# Patient Record
Sex: Male | Born: 2000 | Hispanic: Yes | Marital: Single | Smoking: Never smoker
Health system: Southern US, Community
[De-identification: ages and names within clinical notes are randomized; demographics above are authoritative.]

---
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
## 2019-02-06 ENCOUNTER — Encounter: Payer: Self-pay | Admitting: Nurse Practitioner

## 2019-02-06 ENCOUNTER — Other Ambulatory Visit: Payer: Self-pay

## 2019-02-06 ENCOUNTER — Ambulatory Visit (INDEPENDENT_AMBULATORY_CARE_PROVIDER_SITE_OTHER): Payer: Medicaid Other | Admitting: Nurse Practitioner

## 2019-02-06 VITALS — BP 122/67 | HR 90 | Temp 99.1°F | Resp 20 | Ht 74.0 in | Wt 197.0 lb

## 2019-02-06 DIAGNOSIS — N6310 Unspecified lump in the right breast, unspecified quadrant: Secondary | ICD-10-CM | POA: Diagnosis not present

## 2019-02-06 DIAGNOSIS — N62 Hypertrophy of breast: Secondary | ICD-10-CM | POA: Diagnosis not present

## 2019-02-06 NOTE — Patient Instructions (Signed)
Gynecomastia, Adult Gynecomastia is an overgrowth of gland tissue in a man's breasts. This may cause one or both breasts to become enlarged. This often develops in men who have an imbalance of the male sex hormone (testosterone) and the male sex hormone (estrogen). This means that a man may have too much estrogen, too little testosterone, or both. Gynecomastia may be a normal part of aging for some men. It can also happen to adolescent boys during puberty. What are the causes? Gynecomastia may be caused by:  Certain medicines, such as: ? Estrogen supplements and medicines that act like estrogen in the body. ? Medicines that keep testosterone from functioning normally in the body (testosterone-inhibiting drugs). ? Anabolic steroids. ? Medicines to treat heartburn, cancer, heart disease, mental health problems, HIV (human immunodeficiency virus) or AIDS (acquired immunodeficiency syndrome). ? Antibiotic medicine. ? Chemotherapy medicine.  Recreational drugs, including alcohol, marijuana, and opioids.  Herbal products, including lavender and tea tree oil.  A gene that is passed along from parent to child (inherited).  Tumors in the pituitary or adrenal gland.  An overactive thyroid gland.  Certain inherited disorders, including a genetic disease that causes low testosterone in males (Klinefelter syndrome).  Cancer of the lung, kidney, liver, testicle, or gastrointestinal tract.  Conditions that cause liver or kidney failure.  Poor nutrition and starvation.  Testicle shrinking or failure (testicularatrophy). In some cases, the cause may not be known. What increases the risk? You may have a higher risk for gynecomastia if you:  Are 50 years old or older.  Are overweight.  Abuse alcohol or other drugs.  Have a family history of gynecomastia. What are the signs or symptoms?  Most of the time, breast enlargement is the only symptom. The enlargement may start near the nipple,  and the breast tissue may feel firm and rubbery. The breast may feel itchy, painful or tender. How is this diagnosed? This condition may be diagnosed based on:  Your symptoms.  Your medical history.  A physical exam.  Imaging tests, such as: ? An ultrasound. ? A mammogram. ? An MRI.  Blood tests.  Removal of a sample of breast tissue to be tested in a lab (biopsy). How is this treated? Gynecomastia may go away on its own, without treatment. If gynecomastia is caused by a medical problem or drug abuse, treatment may include:  Getting treatment for the underlying medical problem or for drug abuse.  Changing or stopping medicines.  Medicines to block the effects of estrogen.  Taking a testosterone replacement.  Surgery to remove breast tissue or any lumps in your breasts.  Breast reduction surgery. This may be a possibility if you have severe or painful gynecomastia. Follow these instructions at home:  Take over-the-counter and prescription medicines only as told by your health care provider.  Talk to your health care provider before taking any herbal medicines or diet supplements.  Do not abuse drugs or alcohol.  Keep all follow-up visits as told by your health care provider. This is important. Contact a health care provider if:  Your breast tissue grows larger or gets more swollen or painful.  You have a lump in your testicle.  You have blood or discharge coming from your nipples.  Your nipple changes shape.  You develop a hard or painful lump in your breast. This information is not intended to replace advice given to you by your health care provider. Make sure you discuss any questions you have with your health care provider. Document   Released: 04/16/2015 Document Revised: 10/14/2015 Document Reviewed: 04/16/2015 Elsevier Patient Education  2020 Elsevier Inc.  

## 2019-02-06 NOTE — Progress Notes (Signed)
   Subjective:    Patient ID: Jordan Alvarado, male    DOB: 2001/02/10, 18 y.o.   MRN: [id number]  Chief Complaint: Knot under right nipple   HPI patine tcomes in c/o of a knot under right nipple. Has been there for several months. He had it in high school and had U/S and was told it was gynocomastia. It seemed to go away or get smaller for awhile and he did not notice it but now it is back.. no nipple drainage. No tenderness.   Review of Systems  Constitutional: Negative.   Respiratory: Negative.   Cardiovascular: Negative.   Neurological: Negative.   Psychiatric/Behavioral: Negative.   All other systems reviewed and are negative.      Objective:   Physical Exam Vitals signs and nursing note reviewed.  Constitutional:      Appearance: Normal appearance.  Cardiovascular:     Rate and Rhythm: Normal rate and regular rhythm.     Heart sounds: Normal heart sounds.  Chest:     Breasts:        Right: Mass present. No swelling, nipple discharge, skin change or tenderness.        Left: Normal.    Neurological:     Mental Status: He is alert.    BP 122/67   Pulse 90   Temp 99.1 F (37.3 C) (Temporal)   Resp 20   Ht 6\' 2"  (1.88 m)   Wt 197 lb (89.4 kg)   SpO2 96%   BMI 25.29 kg/m         Assessment & Plan:  [name] in today with chief complaint of Knot under right nipple   1. Breast mass, right Will talk once results are back - US BREAST COMPLETE UNI RIGHT INC AXILLA; Future  Mary-Margaret Hassell Done, FNP

## 2019-02-25 ENCOUNTER — Ambulatory Visit (HOSPITAL_COMMUNITY): Admission: RE | Admit: 2019-02-25 | Payer: Medicaid Other | Source: Ambulatory Visit

## 2019-03-11 ENCOUNTER — Other Ambulatory Visit: Payer: Self-pay

## 2019-03-11 ENCOUNTER — Ambulatory Visit (HOSPITAL_COMMUNITY)
Admission: RE | Admit: 2019-03-11 | Discharge: 2019-03-11 | Disposition: A | Discharge status: Home / Self Care | Payer: Medicaid Other | Source: Ambulatory Visit | Attending: Nurse Practitioner | Admitting: Nurse Practitioner

## 2019-03-11 DIAGNOSIS — N62 Hypertrophy of breast: Secondary | ICD-10-CM | POA: Insufficient documentation

## 2019-03-11 DIAGNOSIS — N6341 Unspecified lump in right breast, subareolar: Secondary | ICD-10-CM | POA: Diagnosis not present

## 2019-03-11 DIAGNOSIS — N6310 Unspecified lump in the right breast, unspecified quadrant: Secondary | ICD-10-CM

## 2019-05-05 ENCOUNTER — Ambulatory Visit (INDEPENDENT_AMBULATORY_CARE_PROVIDER_SITE_OTHER): Payer: Medicaid Other | Admitting: Family Medicine

## 2019-05-05 ENCOUNTER — Encounter: Payer: Self-pay | Admitting: Family Medicine

## 2019-05-05 DIAGNOSIS — J329 Chronic sinusitis, unspecified: Secondary | ICD-10-CM

## 2019-05-05 DIAGNOSIS — J40 Bronchitis, not specified as acute or chronic: Secondary | ICD-10-CM | POA: Diagnosis not present

## 2019-05-05 MED ORDER — AZITHROMYCIN 250 MG PO TABS: ORAL_TABLET | ORAL | 0 refills | Status: DC

## 2019-05-05 MED ORDER — BENZONATATE 200 MG PO CAPS: 200.0000 mg | ORAL_CAPSULE | Freq: Three times a day (TID) | ORAL | 0 refills | Status: DC | PRN

## 2019-05-05 NOTE — Progress Notes (Signed)
Subjective:    Patient ID: Jordan Alvarado, male    DOB: 2000/03/15, 19 y.o.   MRN: [id number]   HPI: Jordan Alvarado is a 19 y.o. male presenting for sore throat, cough and cold. Denies fever. Onset 1 week ago. Feet got wet working outside in the cold. Cough is nonproductive.   Depression screen [facility] 2/9 02/06/2019 05/17/2018 04/13/2017 04/02/2017 01/23/2017  Decreased Interest 0 0 0 0 0  Down, Depressed, Hopeless 0 0 0 0 0  PHQ - 2 Score 0 0 0 0 0  Altered sleeping - - 0 0 -  Tired, decreased energy - - 0 0 -  Change in appetite - - 0 0 -  Feeling bad or failure about yourself  - - 0 0 -  Trouble concentrating - - 0 0 -  Moving slowly or fidgety/restless - - 0 0 -  Suicidal thoughts - - 0 0 -  PHQ-9 Score - - 0 0 -     Relevant past medical, surgical, family and social history reviewed and updated as indicated.  Interim medical history since our last visit reviewed. Allergies and medications reviewed and updated.  ROS:  Review of Systems  Constitutional: Negative for activity change, appetite change, chills and fever.  HENT: Positive for congestion, postnasal drip and voice change (hoarseness). Negative for ear discharge, ear pain, hearing loss, nosebleeds, rhinorrhea, sinus pressure, sneezing and trouble swallowing.   Respiratory: Positive for cough. Negative for chest tightness and shortness of breath.   Cardiovascular: Negative for chest pain and palpitations.  Skin: Negative for rash.     Social History   Tobacco Use  Smoking Status Never Smoker  Smokeless Tobacco Never Used       Objective:     Wt Readings from Last 3 Encounters:  02/06/19 197 lb (89.4 kg) (91 %, Z= 1.34)*  05/17/18 191 lb (86.6 kg) (90 %, Z= 1.26)*  04/13/17 184 lb 6.4 oz (83.6 kg) (89 %, Z= 1.22)*   * Growth percentiles are based on CDC (Boys, 2-20 Years) data.     Exam deferred. Pt. Harboring due to COVID 19. Phone visit performed.   Assessment & Plan:   1. Sinobronchitis     Meds ordered  this encounter  Medications  . azithromycin (ZITHROMAX Z-PAK) 250 MG tablet    Sig: Take two right away Then one a day for the next 4 days.    Dispense:  6 each    Refill:  0  . benzonatate (TESSALON) 200 MG capsule    Sig: Take 1 capsule (200 mg total) by mouth 3 (three) times daily as needed for cough.    Dispense:  20 capsule    Refill:  0    No orders of the defined types were placed in this encounter.     Diagnoses and all orders for this visit:  Sinobronchitis -     azithromycin (ZITHROMAX Z-PAK) 250 MG tablet; Take two right away Then one a day for the next 4 days. -     benzonatate (TESSALON) 200 MG capsule; Take 1 capsule (200 mg total) by mouth 3 (three) times daily as needed for cough.    Virtual Visit via telephone Note  I discussed the limitations, risks, security and privacy concerns of performing an evaluation and management service by telephone and the availability of in person appointments. The patient was identified with two identifiers. Pt.expressed understanding and agreed to proceed. Pt. Is at home. Dr. Darlyn Read is in his office.  Follow Up Instructions:   I discussed the assessment and treatment plan with the patient. The patient was provided an opportunity to ask questions and all were answered. The patient agreed with the plan and demonstrated an understanding of the instructions.   The patient was advised to call back or seek an in-person evaluation if the symptoms worsen or if the condition fails to improve as anticipated.   Total minutes including chart review and phone contact time: 12   Follow up plan: Return if symptoms worsen or fail to improve.  Claretta Fraise, MD [place]

## 2019-06-02 ENCOUNTER — Telehealth: Payer: Self-pay | Admitting: Family Medicine

## 2019-06-02 MED ORDER — LORATADINE 10 MG PO TABS: 10.0000 mg | ORAL_TABLET | Freq: Every day | ORAL | 11 refills | Status: DC

## 2019-06-02 NOTE — Telephone Encounter (Signed)
Refill sent to pharmacy.   

## 2019-06-02 NOTE — Telephone Encounter (Signed)
  Prescription Request  06/02/2019  What is the name of the medication or equipment? Allergy Rx's  Have you contacted your pharmacy to request a refill? (if applicable) Yes  Which pharmacy would you like this sent to? [facility]  Pt says he is out of his allergy medicines and is requesting refills. Recently had televisit with Dr Darlyn Read on 05/05/19   Patient notified that their request is being sent to the clinical staff for review and that they should receive a response within 2 business days.

## 2019-06-04 ENCOUNTER — Encounter: Payer: Self-pay | Admitting: Family Medicine

## 2019-06-04 ENCOUNTER — Telehealth (INDEPENDENT_AMBULATORY_CARE_PROVIDER_SITE_OTHER): Payer: Medicaid Other | Admitting: Family Medicine

## 2019-06-04 DIAGNOSIS — J302 Other seasonal allergic rhinitis: Secondary | ICD-10-CM | POA: Diagnosis not present

## 2019-06-04 MED ORDER — FLUTICASONE PROPIONATE 50 MCG/ACT NA SUSP: 1.0000 | Freq: Two times a day (BID) | NASAL | 6 refills | Status: DC | PRN

## 2019-06-04 MED ORDER — OLOPATADINE HCL 0.2 % OP SOLN: 1.0000 [drp] | Freq: Every day | OPHTHALMIC | 5 refills | Status: DC

## 2019-06-04 NOTE — Progress Notes (Signed)
Virtual Visit via Telephone Note  I connected with Jordan Alvarado on 06/04/19 at 1:41 PM by telephone and verified that I am speaking with the correct person using two identifiers. Jordan Alvarado is currently located at home and nobody is currently with him during this visit. The provider, Loman Brooklyn, FNP is located in their home at time of visit.  I discussed the limitations, risks, security and privacy concerns of performing an evaluation and management service by telephone and the availability of in person appointments. I also discussed with the patient that there may be a patient responsible charge related to this service. The patient expressed understanding and agreed to proceed.  Subjective: PCP: Dettinger, Fransisca Kaufmann, MD  Chief Complaint  Patient presents with  . Allergies   Patient reports he needs refills on allergy medications. Symptoms include itchy/watery eyes, sneezing, and runny nose which have been on going for the past week.    ROS: Per HPI  Current Outpatient Medications:  .  azithromycin (ZITHROMAX Z-PAK) 250 MG tablet, Take two right away Then one a day for the next 4 days., Disp: 6 each, Rfl: 0 .  benzonatate (TESSALON) 200 MG capsule, Take 1 capsule (200 mg total) by mouth 3 (three) times daily as needed for cough., Disp: 20 capsule, Rfl: 0 .  cetirizine (ZYRTEC) 10 MG tablet, Take 1 tablet (10 mg total) by mouth daily., Disp: 90 tablet, Rfl: 1 .  fexofenadine (ALLEGRA ALLERGY) 60 MG tablet, Take 1 tablet (60 mg total) by mouth 2 (two) times daily., Disp: 60 tablet, Rfl: 5 .  fluticasone (FLONASE) 50 MCG/ACT nasal spray, Place 1 spray into both nostrils 2 (two) times daily as needed for allergies or rhinitis., Disp: 16 g, Rfl: 6 .  loratadine (CLARITIN) 10 MG tablet, Take 1 tablet (10 mg total) by mouth daily., Disp: 30 tablet, Rfl: 11  Allergies  Allergen Reactions  . Robitussin (Alcohol Free) [Guaifenesin] Rash    Red rash   History reviewed. No pertinent past  medical history.  Observations/Objective: A&O  No respiratory distress or wheezing audible over the phone Mood, judgement, and thought processes all WNL  Assessment and Plan: 1. Seasonal allergies - Claritin was refilled two days ago, patient was unaware. Refilled Flonase. Advised to use Flonase in the AM and Claritin in the PM. Pataday eye drops also prescribed.   - fluticasone (FLONASE) 50 MCG/ACT nasal spray; Place 1 spray into both nostrils 2 (two) times daily as needed for allergies or rhinitis.  Dispense: 16 g; Refill: 6 - Olopatadine HCl (PATADAY) 0.2 % SOLN; Apply 1 drop to eye daily.  Dispense: 2.5 mL; Refill: 5   Follow Up Instructions:  I discussed the assessment and treatment plan with the patient. The patient was provided an opportunity to ask questions and all were answered. The patient agreed with the plan and demonstrated an understanding of the instructions.   The patient was advised to call back or seek an in-person evaluation if the symptoms worsen or if the condition fails to improve as anticipated.  The above assessment and management plan was discussed with the patient. The patient verbalized understanding of and has agreed to the management plan. Patient is aware to call the clinic if symptoms persist or worsen. Patient is aware when to return to the clinic for a follow-up visit. Patient educated on when it is appropriate to go to the emergency department.   Time call ended: 1:45 PM  I provided 6 minutes of non-face-to-face time during  this encounter.  Deliah Boston, MSN, APRN, FNP-C [facility] 06/04/19

## 2020-06-29 ENCOUNTER — Encounter: Payer: Self-pay | Admitting: Nurse Practitioner

## 2020-06-29 ENCOUNTER — Ambulatory Visit (INDEPENDENT_AMBULATORY_CARE_PROVIDER_SITE_OTHER): Payer: Medicaid Other

## 2020-06-29 ENCOUNTER — Other Ambulatory Visit: Payer: Self-pay

## 2020-06-29 ENCOUNTER — Ambulatory Visit: Payer: Medicaid Other | Admitting: Nurse Practitioner

## 2020-06-29 VITALS — BP 133/75 | HR 65 | Temp 98.3°F | Ht 74.0 in | Wt 201.0 lb

## 2020-06-29 DIAGNOSIS — M25572 Pain in left ankle and joints of left foot: Secondary | ICD-10-CM | POA: Diagnosis not present

## 2020-06-29 DIAGNOSIS — S99912A Unspecified injury of left ankle, initial encounter: Secondary | ICD-10-CM | POA: Diagnosis not present

## 2020-06-29 MED ORDER — IBUPROFEN 600 MG PO TABS: 600.0000 mg | ORAL_TABLET | Freq: Three times a day (TID) | ORAL | 0 refills | Status: DC | PRN

## 2020-06-29 NOTE — Patient Instructions (Addendum)
Immobilize ankle joint, apply ice, ibuprofen for pain.  Completed x-ray results pending.  Follow-up with worsening unresolved symptoms.    Ankle Pain The ankle joint holds your body weight and allows you to move around. Ankle pain can occur on either side or the back of one ankle or both ankles. Ankle pain may be sharp and burning or dull and aching. There may be tenderness, stiffness, redness, or warmth around the ankle. Many things can cause ankle pain, including an injury to the area and overuse of the ankle. Follow these instructions at home: Activity  Rest your ankle as told by your health care provider. Avoid any activities that cause ankle pain.  Do not use the injured limb to support your body weight until your health care provider says that you can. Use crutches as told by your health care provider.  Do exercises as told by your health care provider.  Ask your health care provider when it is safe to drive if you have a brace on your ankle. If you have a brace:  Wear the brace as told by your health care provider. Remove it only as told by your health care provider.  Loosen the brace if your toes tingle, become numb, or turn cold and blue.  Keep the brace clean.  If the brace is not waterproof: ? Do not let it get wet. ? Cover it with a watertight covering when you take a bath or shower. If you were given an elastic bandage:  Remove it when you take a bath or a shower.  Try not to move your ankle very much, but wiggle your toes from time to time. This helps to prevent swelling.  Adjust the bandage to make it more comfortable if it feels too tight.  Loosen the bandage if you have numbness or tingling in your foot or if your foot turns cold and blue.   Managing pain, stiffness, and swelling  If directed, put ice on the painful area. ? If you have a removable brace or elastic bandage, remove it as told by your health care provider. ? Put ice in a plastic bag. ? Place a  towel between your skin and the bag. ? Leave the ice on for 20 minutes, 2-3 times a day.  Move your toes often to avoid stiffness and to lessen swelling.  Raise (elevate) your ankle above the level of your heart while you are sitting or lying down.   General instructions  Record information about your pain. Writing down the following may be helpful for you and your health care provider: ? How often you have ankle pain. ? Where the pain is located. ? What the pain feels like.  If treatment involves wearing a prescribed shoe or insole, make sure you wear it correctly and for as long as told by your health care provider.  Take over-the-counter and prescription medicines only as told by your health care provider.  Keep all follow-up visits as told by your health care provider. This is important. Contact a health care provider if:  Your pain gets worse.  Your pain is not relieved with medicines.  You have a fever or chills.  You are having more trouble with walking.  You have new symptoms. Get help right away if:  Your foot, leg, toes, or ankle: ? Tingles or becomes numb. ? Becomes swollen. ? Turns pale or blue. Summary  Ankle pain can occur on either side or the back of one ankle or  both ankles.  Ankle pain may be sharp and burning or dull and aching.  Rest your ankle as told by your health care provider. If told, apply ice to the area.  Take over-the-counter and prescription medicines only as told by your health care provider. This information is not intended to replace advice given to you by your health care provider. Make sure you discuss any questions you have with your health care provider. Document Revised: 06/11/2018 Document Reviewed: 08/29/2017 Elsevier Patient Education  [place].

## 2020-06-29 NOTE — Assessment & Plan Note (Signed)
Take medication as prescribed. Continue to apply ice Immobilize joint Follow-up with worsening unresolved symptoms. Completed ankle x-ray results pending. In the future may have to use physical therapy.

## 2020-06-29 NOTE — Progress Notes (Signed)
Acute Office Visit  Subjective:    Patient ID: Jordan Alvarado, male    DOB: Jun 26, 2000, 20 y.o.   MRN: [id number]  Chief Complaint  Patient presents with  . Ankle Injury    HPI Patient is in today for Pain  He reports new onset left ankle pain. was an injury that may have caused the pain patient reports hearing a pop sound while playing soccer. The pain started a few days ago and is staying constant. The pain does not radiate . The pain is described as aching, is moderate in intensity, occurring intermittently. Symptoms are worse in the: mid-day, afternoon  Aggravating factors: walking Relieving factors: none.  He has tried acetaminophen with little relief.   ---------------------------------------------------------------------------------------------------      Social History   Socioeconomic History  . Marital status: Single    Spouse name: Not on file  . Number of children: Not on file  . Years of education: Not on file  . Highest education level: Not on file  Occupational History  . Not on file  Tobacco Use  . Smoking status: Never Smoker  . Smokeless tobacco: Never Used  Substance and Sexual Activity  . Alcohol use: No  . Drug use: Not on file  . Sexual activity: Not on file  Other Topics Concern  . Not on file  Social History Narrative  . Not on file   Social Determinants of Health   Financial Resource Strain: Not on file  Food Insecurity: Not on file  Transportation Needs: Not on file  Physical Activity: Not on file  Stress: Not on file  Social Connections: Not on file  Intimate Partner Violence: Not on file    Outpatient Medications Prior to Visit  Medication Sig Dispense Refill  . fluticasone (FLONASE) 50 MCG/ACT nasal spray Place 1 spray into both nostrils 2 (two) times daily as needed for allergies or rhinitis. 16 g 6  . loratadine (CLARITIN) 10 MG tablet Take 1 tablet (10 mg total) by mouth daily. 30 tablet 11  . Olopatadine HCl (PATADAY) 0.2 %  SOLN Apply 1 drop to eye daily. 2.5 mL 5   No facility-administered medications prior to visit.    Allergies  Allergen Reactions  . Robitussin (Alcohol Free) [Guaifenesin] Rash    Red rash    Review of Systems  Constitutional: Negative.   HENT: Negative.   Respiratory: Negative.   Cardiovascular: Negative.   Musculoskeletal: Positive for joint swelling.  Skin: Negative for color change and wound.  All other systems reviewed and are negative.      Objective:    Physical Exam Vitals and nursing note reviewed.  HENT:     Head: Normocephalic.     Nose: Nose normal.  Cardiovascular:     Rate and Rhythm: Normal rate and regular rhythm.     Pulses: Normal pulses.     Heart sounds: Normal heart sounds.  Pulmonary:     Effort: Pulmonary effort is normal.     Breath sounds: Normal breath sounds.  Abdominal:     General: Bowel sounds are normal.  Musculoskeletal:        General: Signs of injury present.     Left ankle: Swelling present. Tenderness present.       Legs:     Comments: Swollen and tender to touch  Neurological:     Mental Status: He is oriented to person, place, and time.     BP 133/75   Pulse 65   Temp 98.3  F (36.8 C) (Temporal)   Ht 6\' 2"  (1.88 m)   Wt 201 lb (91.2 kg)   BMI 25.81 kg/m  Wt Readings from Last 3 Encounters:  02/06/19 197 lb (89.4 kg) (91 %, Z= 1.34)*  05/17/18 191 lb (86.6 kg) (90 %, Z= 1.26)*  04/13/17 184 lb 6.4 oz (83.6 kg) (89 %, Z= 1.22)*   * Growth percentiles are based on CDC (Boys, 2-20 Years) data.    Health Maintenance Due  Topic Date Due  . Hepatitis C Screening  Never done  . HPV VACCINES (1 - Male 2-dose series) Never done  . HIV Screening  Never done  . TETANUS/TDAP  Never done       Topic Date Due  . HPV VACCINES (1 - Male 2-dose series) Never done        Assessment & Plan:  Injury of left ankle Take medication as prescribed. Continue to apply ice Immobilize joint Follow-up with worsening  unresolved symptoms. Completed ankle x-ray results pending. In the future may have to use physical therapy.  Problem List Items Addressed This Visit   None   Visit Diagnoses    Injury of left ankle, initial encounter    -  Primary   Relevant Orders   DG Ankle Complete Left       Meds ordered this encounter  Medications  . ibuprofen (ADVIL) 600 MG tablet    Sig: Take 1 tablet (600 mg total) by mouth every 8 (eight) hours as needed.    Dispense:  30 tablet    Refill:  0    Order Specific Question:   Supervising Provider    Answer:   06/11/17 Raliegh Ip     [[id number]], NP

## 2020-12-29 IMAGING — US US BREAST*R* LIMITED INC AXILLA
1 series · 14 of 18 positions shown · non-contrast
Comparison: None

CLINICAL DATA: 18-year-old male presents for evaluation of a tender
palpable lump in the retroareolar right breast. He states that he
had a similar retroareolar lump on the left that has resolved.

EXAM:
ULTRASOUND OF THE RIGHT BREAST

[Series 1: us breast*right* limited inc axilla · 0.07mm/px · 14 of 18 slices shown]
[im 1/18]
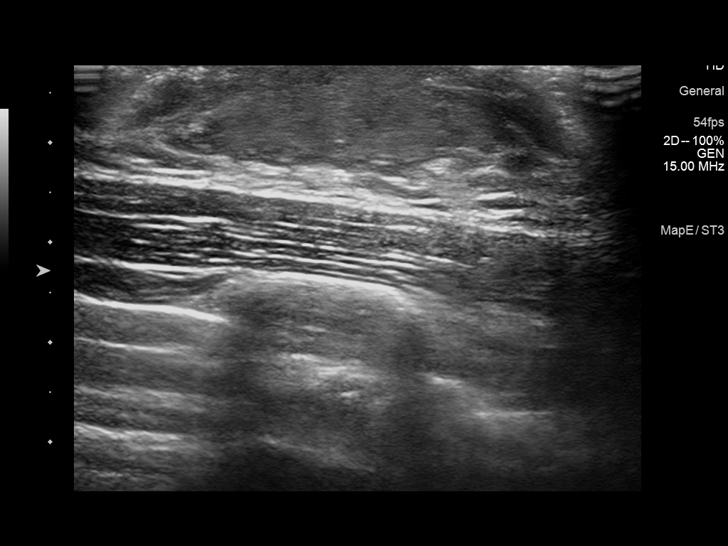
[im 2/18]
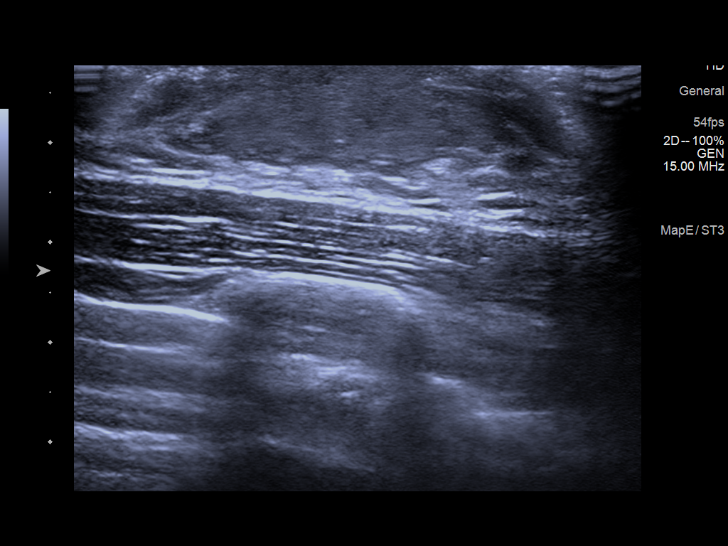
[im 4/18]
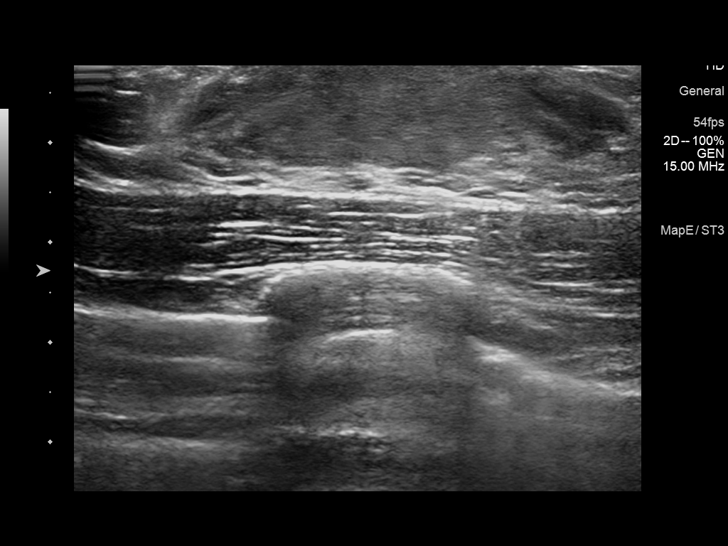
[im 5/18]
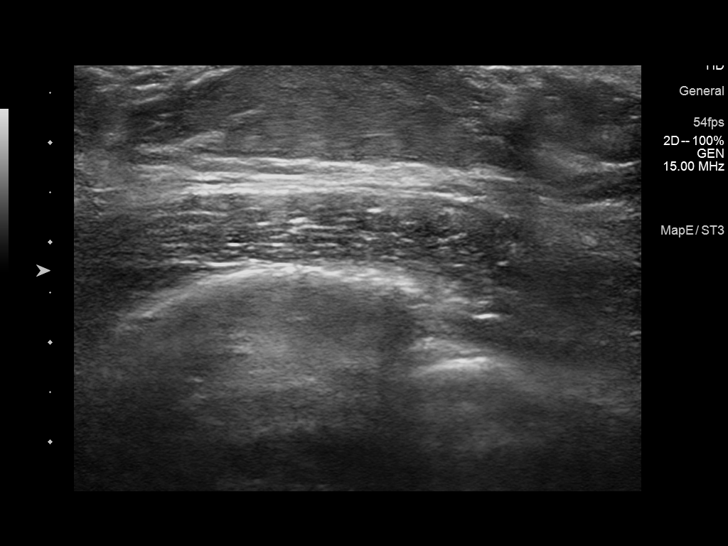
[im 6/18]
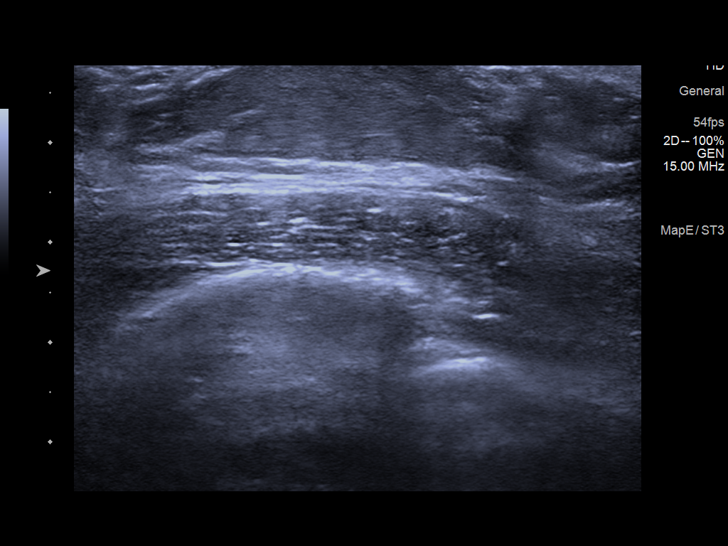
[im 8/18]
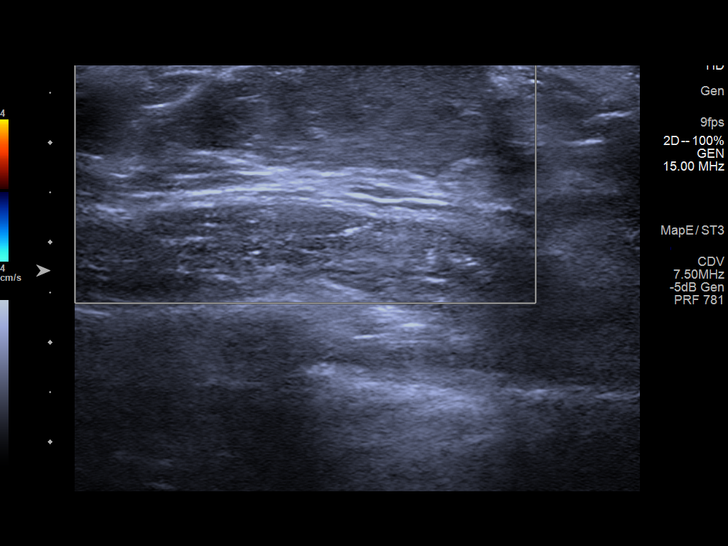
[im 9/18]
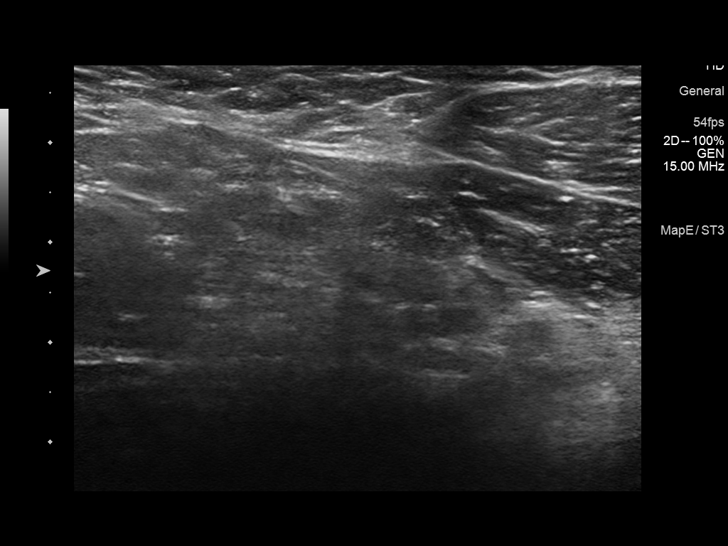
[im 10/18]
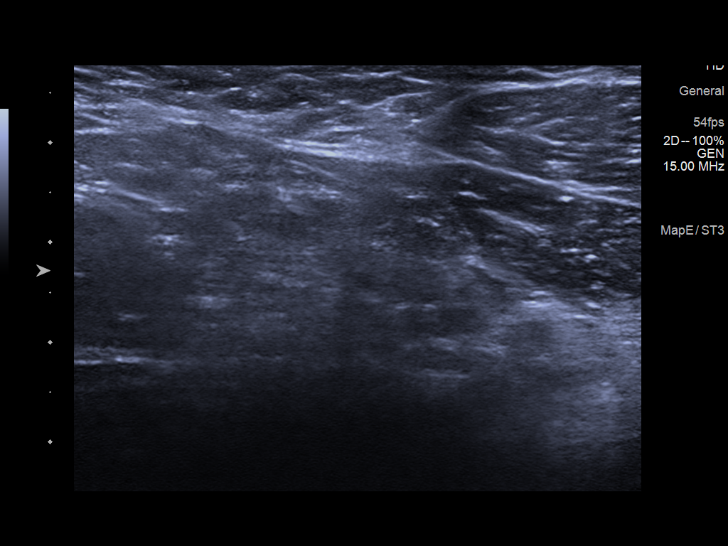
[im 11/18]
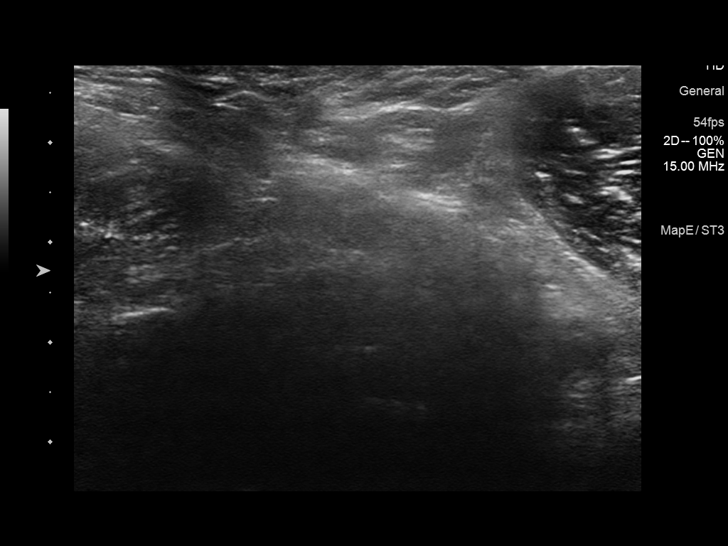
[im 13/18]
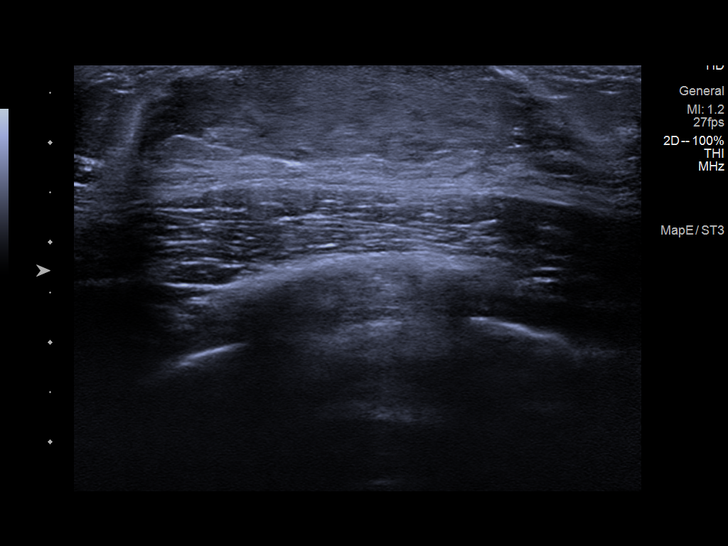
[im 14/18]
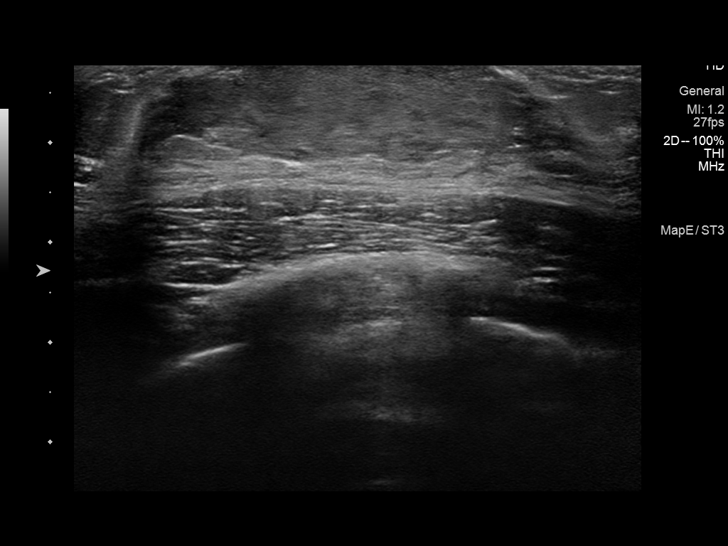
[im 15/18]
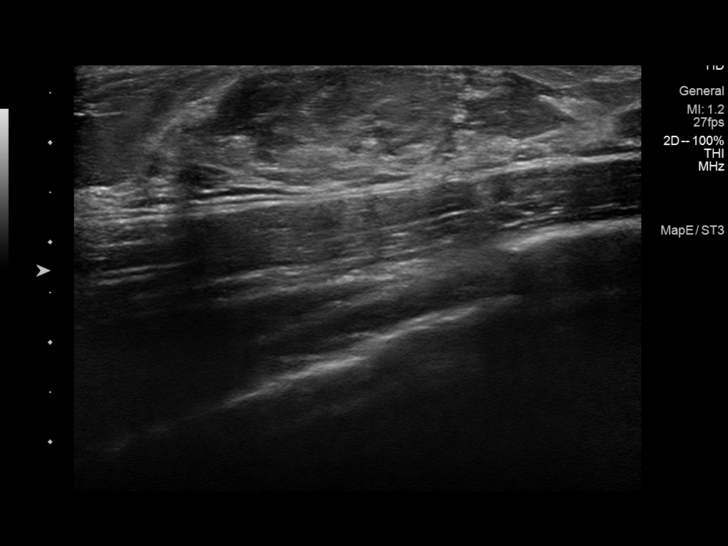
[im 17/18]
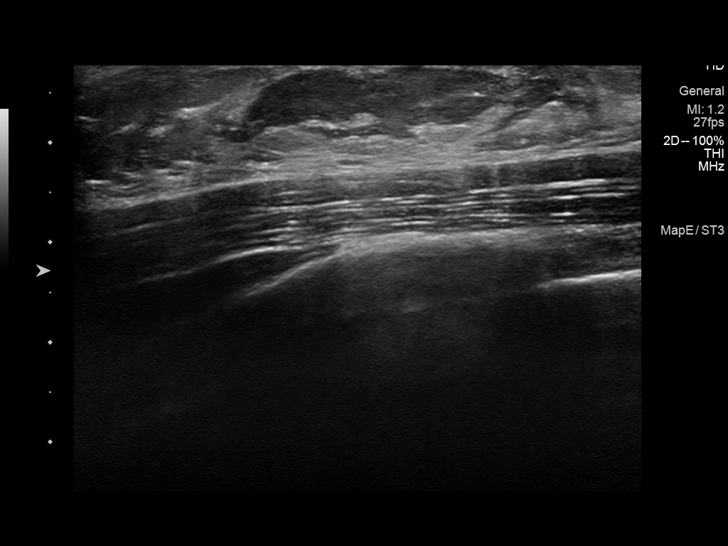
[im 18/18]
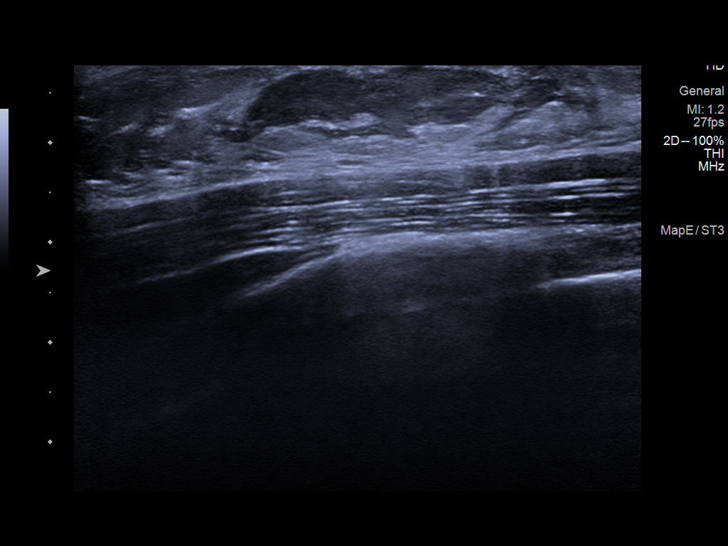

[14 of 18 positions shown; findings below may reference images not displayed]

FINDINGS: On physical exam, there is a spongy, disc-shaped lump in the
retroareolar right breast. The nipple areolar complex and skin
appear normal.

Targeted ultrasound is performed, showing retroareolar
fibroglandular tissue consistent with gynecomastia. No suspicious
mass or fluid collection.
IMPRESSION: Retroareolar right gynecomastia.  No evidence of malignancy.

RECOMMENDATION:
Clinical follow-up. No further imaging follow-up is recommended,
unless there are new or progressive areas of concern.

I have discussed the findings and recommendations with the patient.
If applicable, a reminder letter will be sent to the patient
regarding the next appointment.

BI-RADS CATEGORY  2: Benign.

## 2021-01-21 DIAGNOSIS — R059 Cough, unspecified: Secondary | ICD-10-CM | POA: Diagnosis not present

## 2021-01-21 DIAGNOSIS — R0981 Nasal congestion: Secondary | ICD-10-CM | POA: Diagnosis not present

## 2021-01-21 DIAGNOSIS — Z20822 Contact with and (suspected) exposure to covid-19: Secondary | ICD-10-CM | POA: Diagnosis not present

## 2021-01-21 DIAGNOSIS — J029 Acute pharyngitis, unspecified: Secondary | ICD-10-CM | POA: Diagnosis not present

## 2021-08-08 DIAGNOSIS — S43014A Anterior dislocation of right humerus, initial encounter: Secondary | ICD-10-CM | POA: Diagnosis not present

## 2021-08-08 DIAGNOSIS — S43004A Unspecified dislocation of right shoulder joint, initial encounter: Secondary | ICD-10-CM | POA: Diagnosis not present

## 2021-08-10 ENCOUNTER — Telehealth: Payer: Self-pay

## 2021-08-10 NOTE — Telephone Encounter (Signed)
Transition Care Management Unsuccessful Follow-up Telephone Call  Date of discharge and from where:  08/09/2021 from [facility]  Attempts:  1st Attempt  Reason for unsuccessful TCM follow-up call:  No answer/busy

## 2021-08-11 NOTE — Telephone Encounter (Signed)
Transition Care Management Unsuccessful Follow-up Telephone Call  Date of discharge and from where:  08/09/2021 from [facility]  Attempts:  2nd Attempt  Reason for unsuccessful TCM follow-up call:  No answer/busy

## 2021-08-12 NOTE — Telephone Encounter (Signed)
Transition Care Management Unsuccessful Follow-up Telephone Call  Date of discharge and from where:  08/09/2021 from [facility]  Attempts:  3rd Attempt  Reason for unsuccessful TCM follow-up call:  Unable to reach patient

## 2021-08-25 DIAGNOSIS — M25511 Pain in right shoulder: Secondary | ICD-10-CM | POA: Diagnosis not present

## 2021-08-25 DIAGNOSIS — S43014A Anterior dislocation of right humerus, initial encounter: Secondary | ICD-10-CM | POA: Diagnosis not present

## 2021-09-19 DIAGNOSIS — S43014D Anterior dislocation of right humerus, subsequent encounter: Secondary | ICD-10-CM | POA: Diagnosis not present

## 2021-11-01 ENCOUNTER — Ambulatory Visit: Payer: Medicaid Other | Admitting: Nurse Practitioner

## 2021-11-01 ENCOUNTER — Encounter: Payer: Self-pay | Admitting: Nurse Practitioner

## 2021-11-01 VITALS — BP 133/70 | HR 59 | Temp 98.6°F | Resp 18 | Ht 75.0 in | Wt 201.0 lb

## 2021-11-01 DIAGNOSIS — L0291 Cutaneous abscess, unspecified: Secondary | ICD-10-CM

## 2021-11-01 MED ORDER — DOXYCYCLINE HYCLATE 100 MG PO TABS: 100.0000 mg | ORAL_TABLET | Freq: Two times a day (BID) | ORAL | 0 refills | Status: AC

## 2021-11-01 NOTE — Patient Instructions (Signed)
Skin Abscess  A skin abscess is an infected area of your skin that contains pus and other material. An abscess can happen in any part of your body. Some abscesses break open (rupture) on their own. Most continue to get worse unless they are treated. The infection can spread deeper into the body and into your blood, which can make you feel sick. A skin abscess is caused by germs that enter the skin through a cut or scrape. It can also be caused by blocked oil and sweat glands or infected hair follicles. This condition is usually treated by: Draining the pus. Taking antibiotic medicines. Placing a warm, wet washcloth over the abscess. Follow these instructions at home: Medicines  Take over-the-counter and prescription medicines only as told by your doctor. If you were prescribed an antibiotic medicine, take it as told by your doctor. Do not stop taking the antibiotic even if you start to feel better. Abscess care  If you have an abscess that has not drained, place a warm, clean, wet washcloth over the abscess several times a day. Do this as told by your doctor. Follow instructions from your doctor about how to take care of your abscess. Make sure you: Cover the abscess with a bandage (dressing). Change your bandage or gauze as told by your doctor. Wash your hands with soap and water before you change the bandage or gauze. If you cannot use soap and water, use hand sanitizer. Check your abscess every day for signs that the infection is getting worse. Check for: More redness, swelling, or pain. More fluid or blood. Warmth. More pus or a bad smell. General instructions To avoid spreading the infection: Do not share personal care items, towels, or hot tubs with others. Avoid making skin-to-skin contact with other people. Keep all follow-up visits as told by your doctor. This is important. Contact a doctor if: You have more redness, swelling, or pain around your abscess. You have more fluid  or blood coming from your abscess. Your abscess feels warm when you touch it. You have more pus or a bad smell coming from your abscess. Your muscles ache. You feel sick. Get help right away if: You have very bad (severe) pain. You see red streaks on your skin spreading away from the abscess. You see redness that spreads quickly. You have a fever or chills. Summary A skin abscess is an infected area of your skin that contains pus and other material. The abscess is caused by germs that enter the skin through a cut or scrape. It can also be caused by blocked oil and sweat glands or infected hair follicles. Follow your doctor's instructions on caring for your abscess, taking medicines, preventing infections, and keeping follow-up visits. This information is not intended to replace advice given to you by your health care provider. Make sure you discuss any questions you have with your health care provider. Document Revised: 05/26/2021 Document Reviewed: 11/29/2020 [employer] Patient Education  2023 [employer]  

## 2021-11-01 NOTE — Progress Notes (Signed)
Acute Office Visit  Subjective:     Patient ID: Jordan Alvarado, male    DOB: 12-17-2000, 21 y.o.   MRN: [id number]  Chief Complaint  Patient presents with   skin check    Bump on leg inner thigh - has been there a couple of weeks now , gotten a little bigger under the skin     HPI Patient is in today for Abscess: Patient presents for evaluation of a cutaneous abscess. Lesion is located in the right upper inner thigh. Onset was a few days ago. Symptoms have gradually worsened. Abscess has associated symptoms of pain, swelling. Patient does not have previous history of cutaneous abscesses. Patient does not have diabetes.   Review of Systems  Constitutional:  Negative for chills and fever.  HENT: Negative.    Respiratory: Negative.    Cardiovascular: Negative.   Skin: Negative.  Negative for rash.       Abcess right inner thigh  All other systems reviewed and are negative.       Objective:    BP 133/70   Pulse (!) 59   Temp 98.6 F (37 C)   Resp 18   Ht 6\' 3"  (1.905 m)   Wt 201 lb (91.2 kg)   SpO2 99%   BMI 25.12 kg/m  BP Readings from Last 3 Encounters:  11/01/21 133/70  06/29/20 133/75  02/06/19 122/67   Wt Readings from Last 3 Encounters:  11/01/21 201 lb (91.2 kg)  06/29/20 201 lb (91.2 kg)  02/06/19 197 lb (89.4 kg) (91 %, Z= 1.34)*   * Growth percentiles are based on CDC (Boys, 2-20 Years) data.      Physical Exam Vitals and nursing note reviewed.  Constitutional:      Appearance: Normal appearance.  HENT:     Head: Normocephalic.     Right Ear: External ear normal.     Left Ear: External ear normal.     Nose: Nose normal.     Mouth/Throat:     Mouth: Mucous membranes are moist.  Eyes:     Conjunctiva/sclera: Conjunctivae normal.     Pupils: Pupils are equal, round, and reactive to light.  Cardiovascular:     Rate and Rhythm: Normal rate and regular rhythm.     Pulses: Normal pulses.     Heart sounds: Normal heart sounds.  Pulmonary:      Effort: Pulmonary effort is normal.     Breath sounds: Normal breath sounds.  Abdominal:     General: Bowel sounds are normal.  Skin:    General: Skin is warm.     Findings: Abscess present. No erythema or rash.          Comments: Abscess right upper inner thigh  Neurological:     General: No focal deficit present.     Mental Status: He is alert and oriented to person, place, and time.  Psychiatric:        Mood and Affect: Mood normal.        Behavior: Behavior normal.     No results found for any visits on 11/01/21.      Assessment & Plan:  Patient presents with abscess right upper inner thigh.  Advised patient to put warm compress, do not scratch with nails or force open, doxycycline by mouth twice daily for 7 days.  Follow-up for I&D if needed after 4 to 5 days.  Continue ibuprofen/Tylenol for pain.  Education provided for signs and symptoms of worsening infection.  Printed handouts given Problem List Items Addressed This Visit   None Visit Diagnoses     Abscess    -  Primary   Relevant Medications   doxycycline (VIBRA-TABS) 100 MG tablet       Meds ordered this encounter  Medications   doxycycline (VIBRA-TABS) 100 MG tablet    Sig: Take 1 tablet (100 mg total) by mouth 2 (two) times daily.    Dispense:  14 tablet    Refill:  0    Order Specific Question:   Supervising Provider    Answer:   Mechele Claude [phone]    Return if symptoms worsen or fail to improve.  Daryll Drown, NP

## 2021-11-02 ENCOUNTER — Ambulatory Visit: Payer: Medicaid Other | Admitting: Nurse Practitioner
